# Patient Record
Sex: Male | Born: 1995 | Race: Black or African American | Hispanic: No | Marital: Single | Smoking: Never smoker
Health system: Southern US, Community
[De-identification: ages and names within clinical notes are randomized; demographics above are authoritative.]

---

## 2017-07-29 ENCOUNTER — Encounter (HOSPITAL_COMMUNITY): Payer: Self-pay

## 2017-07-29 ENCOUNTER — Other Ambulatory Visit: Payer: Self-pay

## 2017-07-29 ENCOUNTER — Emergency Department (HOSPITAL_COMMUNITY)
Admission: EM | Admit: 2017-07-29 | Discharge: 2017-07-29 | Disposition: A | Discharge status: Home / Self Care | Payer: Commercial Managed Care - PPO | Attending: Emergency Medicine | Admitting: Emergency Medicine

## 2017-07-29 ENCOUNTER — Emergency Department (HOSPITAL_COMMUNITY): Payer: Commercial Managed Care - PPO

## 2017-07-29 DIAGNOSIS — F12188 Cannabis abuse with other cannabis-induced disorder: Secondary | ICD-10-CM

## 2017-07-29 DIAGNOSIS — R112 Nausea with vomiting, unspecified: Secondary | ICD-10-CM | POA: Diagnosis present

## 2017-07-29 DIAGNOSIS — R451 Restlessness and agitation: Secondary | ICD-10-CM | POA: Insufficient documentation

## 2017-07-29 LAB — CBC WITH DIFFERENTIAL/PLATELET
Basophils Absolute: 0 10*3/uL (ref 0.0–0.1)
Basophils Relative: 0 %
EOS PCT: 0 %
Eosinophils Absolute: 0 10*3/uL (ref 0.0–0.7)
HCT: 39.8 % (ref 39.0–52.0)
HEMOGLOBIN: 13.5 g/dL (ref 13.0–17.0)
LYMPHS PCT: 11 %
Lymphs Abs: 1.5 10*3/uL (ref 0.7–4.0)
MCH: 29.9 pg (ref 26.0–34.0)
MCHC: 33.9 g/dL (ref 30.0–36.0)
MCV: 88.2 fL (ref 78.0–100.0)
MONOS PCT: 9 %
Monocytes Absolute: 1.2 10*3/uL — ABNORMAL HIGH (ref 0.1–1.0)
Neutro Abs: 11 10*3/uL — ABNORMAL HIGH (ref 1.7–7.7)
Neutrophils Relative %: 80 %
PLATELETS: 135 10*3/uL — AB (ref 150–400)
RBC: 4.51 MIL/uL (ref 4.22–5.81)
RDW: 13.4 % (ref 11.5–15.5)
WBC: 13.7 10*3/uL — AB (ref 4.0–10.5)

## 2017-07-29 LAB — COMPREHENSIVE METABOLIC PANEL
ALK PHOS: 53 U/L (ref 38–126)
ALT: 21 U/L (ref 17–63)
AST: 27 U/L (ref 15–41)
Albumin: 4.4 g/dL (ref 3.5–5.0)
Anion gap: 8 (ref 5–15)
BUN: 20 mg/dL (ref 6–20)
CO2: 23 mmol/L (ref 22–32)
Calcium: 9 mg/dL (ref 8.9–10.3)
Chloride: 109 mmol/L (ref 101–111)
Creatinine, Ser: 1.12 mg/dL (ref 0.61–1.24)
Glucose, Bld: 112 mg/dL — ABNORMAL HIGH (ref 65–99)
Potassium: 3.8 mmol/L (ref 3.5–5.1)
Sodium: 140 mmol/L (ref 135–145)
TOTAL PROTEIN: 6.6 g/dL (ref 6.5–8.1)
Total Bilirubin: 1.2 mg/dL (ref 0.3–1.2)

## 2017-07-29 LAB — RAPID URINE DRUG SCREEN, HOSP PERFORMED
AMPHETAMINES: NOT DETECTED
BARBITURATES: NOT DETECTED
BENZODIAZEPINES: NOT DETECTED
COCAINE: NOT DETECTED
OPIATES: NOT DETECTED
TETRAHYDROCANNABINOL: POSITIVE — AB

## 2017-07-29 LAB — LIPASE, BLOOD: Lipase: 24 U/L (ref 11–51)

## 2017-07-29 LAB — ETHANOL: Alcohol, Ethyl (B): <10 mg/dL (ref <10)

## 2017-07-29 MED ORDER — SODIUM CHLORIDE 0.9 % IV BOLUS (SEPSIS)
1000.0000 mL | Freq: Once | INTRAVENOUS | Status: AC
  Administered 2017-07-29: 1000 mL via INTRAVENOUS

## 2017-07-29 MED ORDER — LORAZEPAM 2 MG/ML IJ SOLN
2.0000 mg | Freq: Once | INTRAMUSCULAR | Status: AC
  Administered 2017-07-29: 2 mg via INTRAVENOUS
  Filled 2017-07-29: qty 1

## 2017-07-29 MED ORDER — HALOPERIDOL LACTATE 5 MG/ML IJ SOLN: 5.0000 mg | Freq: Once | INTRAMUSCULAR | Status: DC

## 2017-07-29 MED ORDER — CAPSAICIN 0.025 % EX CREA
TOPICAL_CREAM | Freq: Once | CUTANEOUS | Status: DC
  Filled 2017-07-29: qty 60

## 2017-07-29 MED ORDER — ONDANSETRON HCL 4 MG/2ML IJ SOLN
4.0000 mg | Freq: Once | INTRAMUSCULAR | Status: AC
  Administered 2017-07-29: 4 mg via INTRAVENOUS
  Filled 2017-07-29: qty 2

## 2017-07-29 MED ORDER — HALOPERIDOL LACTATE 5 MG/ML IJ SOLN
2.0000 mg | Freq: Once | INTRAMUSCULAR | Status: AC
  Administered 2017-07-29: 2 mg via INTRAVENOUS
  Filled 2017-07-29: qty 1

## 2017-07-29 NOTE — ED Triage Notes (Signed)
Pt admits to etoh use pot use now abdominal pain with nausea voiced pt diaphoretic and unable to lie still.

## 2017-07-29 NOTE — Discharge Instructions (Signed)
Your nausea and vomiting is likely due to marijuana use, please stop using marijuana, this will be the best thing to treat the symptoms.  You can use Zofran to treat your symptoms.  Please drink lots of fluids, start with bland foods and advance to your typical diet.  Please use the phone number provided to establish care with a primary doctor.  If you are having worsening abdominal pain fevers or chills, or nausea and vomiting despite medications please return to the emergency department for sooner evaluation.

## 2017-07-29 NOTE — ED Notes (Signed)
Bed: WA17 Expected date:  Expected time:  Means of arrival:  Comments: etoh 

## 2017-07-29 NOTE — ED Provider Notes (Signed)
[facility]-EMERGENCY DEPT Provider Note   CSN: [id number] Arrival date & time: 07/29/17  0544     History   Chief Complaint Chief Complaint  Patient presents with  . Alcohol Intoxication    HPI  Douglas Fisher is a 21 y.o. Male with no pertinent past medical history, who presents with persistent nausea and vomiting and epigastric abdominal pain.  Patient reports he was drinking for most of the day yesterday as well as on Friday night and then started having vomiting and he was unable to stop. Pt reports there is nothing left on his stomach but he has had persistent dry heaving. No hematemesis. Pt describes abdominal pain as diffuse, but worse in the epigastrium, pain is constant. No meds PTA to treat these symptoms. Pt denies fevers or chills, no diarrhea or constipation. Patient endorses heavy marijuana daily, denies any other drugs.  Reports he does not typically drink like that, had not had any alcohol in the days prior.  Patient denies any chest pain or shortness of breath.  Reports slight frontal  headache and continued nausea, otherwise no complaints.        History reviewed. No pertinent past medical history.  There are no active problems to display for this patient.   History reviewed. No pertinent surgical history.     Home Medications    Prior to Admission medications   Not on File    Family History History reviewed. No pertinent family history.  Social History Social History   Tobacco Use  . Smoking status: Never Smoker  . Smokeless tobacco: Never Used  Substance Use Topics  . Alcohol use: Yes  . Drug use: Yes    Types: Marijuana     Allergies   Patient has no known allergies.   Review of Systems Review of Systems  Constitutional: Negative for chills and fever.  HENT: Negative for congestion, rhinorrhea and sore throat.   Eyes: Negative for photophobia and visual disturbance.  Respiratory: Negative for cough, chest  tightness and shortness of breath.   Cardiovascular: Negative for chest pain and palpitations.  Gastrointestinal: Positive for abdominal pain, nausea and vomiting. Negative for blood in stool, constipation and diarrhea.  Genitourinary: Negative for dysuria and flank pain.  Musculoskeletal: Negative for arthralgias and myalgias.  Skin: Negative for pallor and rash.  Neurological: Positive for headaches. Negative for dizziness, seizures, syncope, weakness and numbness.     Physical Exam Updated Vital Signs Ht 6' (1.829 m)   Wt 72.6 kg (160 lb)   BMI 21.70 kg/m   Physical Exam  Constitutional: He is oriented to person, place, and time. He appears well-developed and well-nourished.  Pt agitated with persistent retching on arrival, given ativan and zofran. On initial evaluation pt is calm and somnolent but easily arouseable and able to answer questions appropriately  HENT:  Head: Normocephalic and atraumatic.  Eyes: Right eye exhibits no discharge. Left eye exhibits no discharge.  Cardiovascular: Normal rate, regular rhythm, normal heart sounds and intact distal pulses.  Pulmonary/Chest: Effort normal and breath sounds normal. No stridor. No respiratory distress. He has no wheezes. He has no rales. He exhibits no tenderness.  Abdominal: Soft. Bowel sounds are normal. He exhibits no distension and no mass. There is tenderness. There is no guarding.  Abd is soft and nondistended, mild tenderness throughout, worse at epigastrium, no guarding, negative Murphy's sign, nontender at McBurney's point, no peritoneal signs. No CVA tenderness  Musculoskeletal: He exhibits no edema or deformity.  Neurological:  He is alert and oriented to person, place, and time. Coordination normal.  Skin: Skin is warm and dry. Capillary refill takes less than 2 seconds.  Psychiatric: He has a normal mood and affect. His behavior is normal.  Nursing note and vitals reviewed.    ED Treatments / Results  Labs (all  labs ordered are listed, but only abnormal results are displayed) Labs Reviewed  CBC WITH DIFFERENTIAL/PLATELET - Abnormal; Notable for the following components:      Result Value   WBC 13.7 (*)    Platelets 135 (*)    Neutro Abs 11.0 (*)    Monocytes Absolute 1.2 (*)    All other components within normal limits  COMPREHENSIVE METABOLIC PANEL - Abnormal; Notable for the following components:   Glucose, Bld 112 (*)    All other components within normal limits  RAPID URINE DRUG SCREEN, HOSP PERFORMED - Abnormal; Notable for the following components:   Tetrahydrocannabinol POSITIVE (*)    All other components within normal limits  LIPASE, BLOOD  ETHANOL    EKG  EKG Interpretation  Date/Time:  Sunday July 29 2017 10:16:33 EST Ventricular Rate:  60 PR Interval:    QRS Duration: 93 QT Interval:  459 QTC Calculation: 459 R Axis:   82 Text Interpretation:  Sinus rhythm RSR' in V1 or V2, probably normal variant LVH by voltage Nonspecific T abnrm, anterolateral leads ST elev, probable normal early repol pattern Confirmed by Tilden Fossa [id number]) on 07/29/2017 11:54:16 AM       Radiology Dg Chest 2 View  Result Date: 07/29/2017 CLINICAL DATA:  21 year old male with abdominal pain, nausea and diaphoresis. EXAM: CHEST  2 VIEW COMPARISON:  None. FINDINGS: Technologist notes the patient could not tolerate optimal positioning; the best possible images were obtained. The heart size and mediastinal contours are within normal limits. Both lungs are clear. The visualized skeletal structures are unremarkable. IMPRESSION: No active cardiopulmonary disease. Electronically Signed   By: Sande Brothers M.D.   On: 07/29/2017 09:19    Procedures Procedures (including critical care time)  Medications Ordered in ED Medications  LORazepam (ATIVAN) injection 2 mg (2 mg Intravenous Given 07/29/17 0620)  sodium chloride 0.9 % bolus 1,000 mL (1,000 mLs Intravenous New Bag/Given 07/29/17 0619)    ondansetron (ZOFRAN) injection 4 mg (4 mg Intravenous Given 07/29/17 0620)     Initial Impression / Assessment and Plan / ED Course  I have reviewed the triage vital signs and the nursing notes.  Pertinent labs & imaging results that were available during my care of the patient were reviewed by me and considered in my medical decision making (see chart for details).  Pt presents with persistent nausea and vomiting, pt reports heaving alcohol use yesterday and heavy daily marijuana use. On further review of pts chart in CareEverywhere, pt has been seen multiple times with similar presentation and diagnosed with cannabis hyperemesis syndrome. Pt has had multiple negative abdominal CT scans in the past year. Vital signs normal and pt in no acute distress after ativan and zofran. Pt with some diffuse tenderness on exam, worst at epigastrium, no peritoneal signs, doubt intraabdominal pathology requiring surgical intervention. Do not think that imaging would be helpful at this time. Concern for alcohol intoxication versus cannabis hyperemesis. Will hydrate with fluid bolus, and get labs.  Labs shows leukocytosis of 13.7 with left shift, can be seen with persistent retching, more likely than infection , Kidney and liver function normal, lipase normal. Ethanol level < 10 making  acute alcohol intoxication unlikley, doubt withdrawal as pt says he does not use alcohol daily. UDS is positive for THC. CXR ordered to rule out aspiration, negative for infiltrate.  10:00AM Alerted by nurse that pt has started retching again, highly suspicious for cannabis hyperemesis syndrome, will try haldol and capsaicin cream. EKG ordered to check QTc since haldol is being used, EKG unremarkable.  11:00 AM On re-eval pt is somnolent, but arousable. No additional episodes of retching. Pt reports improvement in abdominal pain, abdomen is soft and NTTP on exam. Will give medication a chance to metabolize and pt to wake up more and  then PO challenge and reassess.  On reevaluation pt able to tolerate PO fluids and solids. Pt able to ambulate steadily in the hallway without assistance. Pt stable for discharge home with zofran for nausea. Counseled pt on cessation of cannabis use. Pt to follow up with PCP. Return precautions provided. Pt expresses understanding and agrees with plan.  Patient discussed with Dr. Madilyn Hook, who saw patient as well and agrees with plan.  Final Clinical Impressions(s) / ED Diagnoses   Final diagnoses:  Cannabis hyperemesis syndrome concurrent with and due to cannabis abuse [facility])    New Prescriptions This SmartLink is deprecated. Use AVSMEDLIST instead to display the medication list for a patient.   Dartha Lodge, PA-C 07/29/17 1936    Tilden Fossa, MD 08/03/17 [phone]

## 2018-12-02 IMAGING — CR DG CHEST 2V
2 series · 2 of 2 positions shown · non-contrast
Comparison: None.

CLINICAL DATA: 21-year-old male with abdominal pain, nausea and
diaphoresis.

EXAM:
CHEST  2 VIEW

[w chest pa]
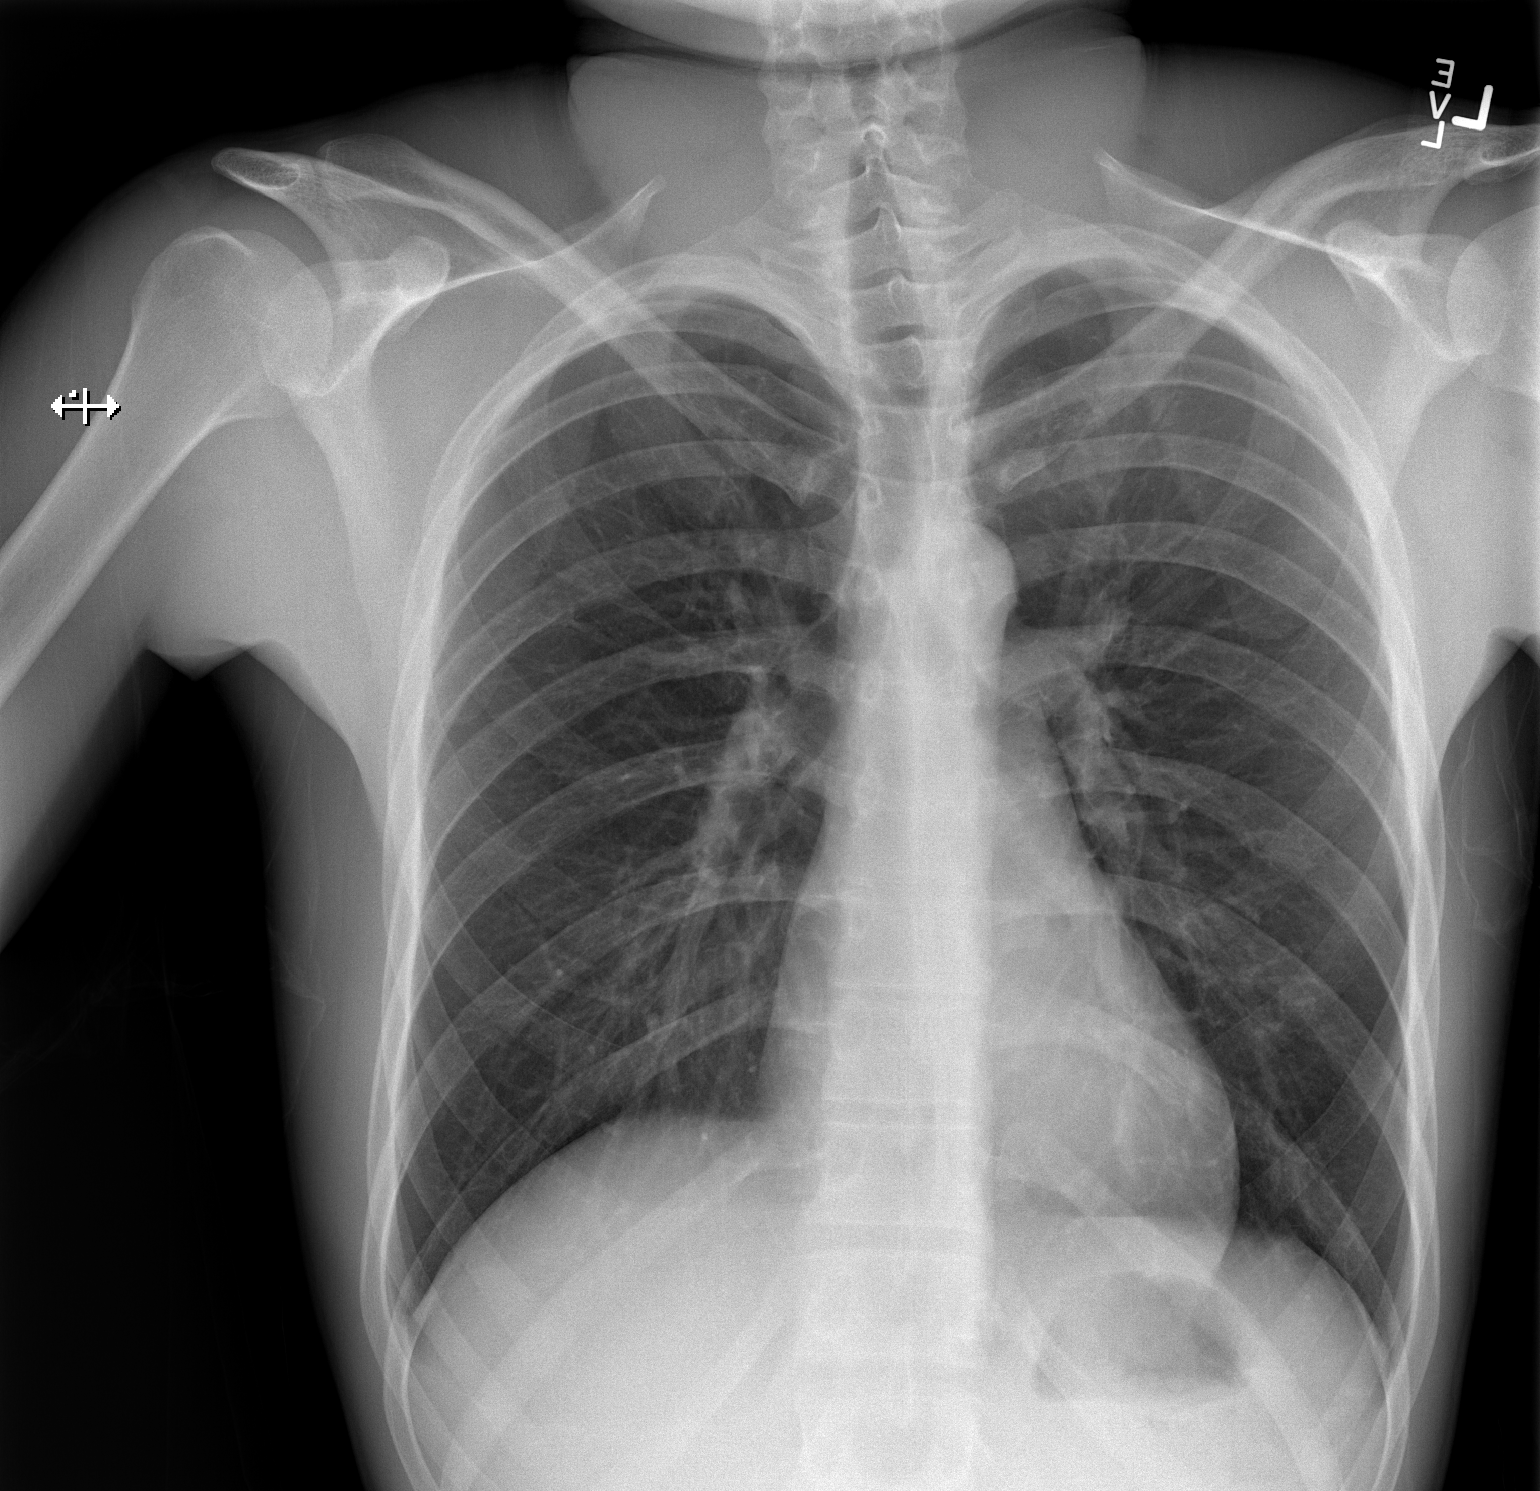

[w chest lat]
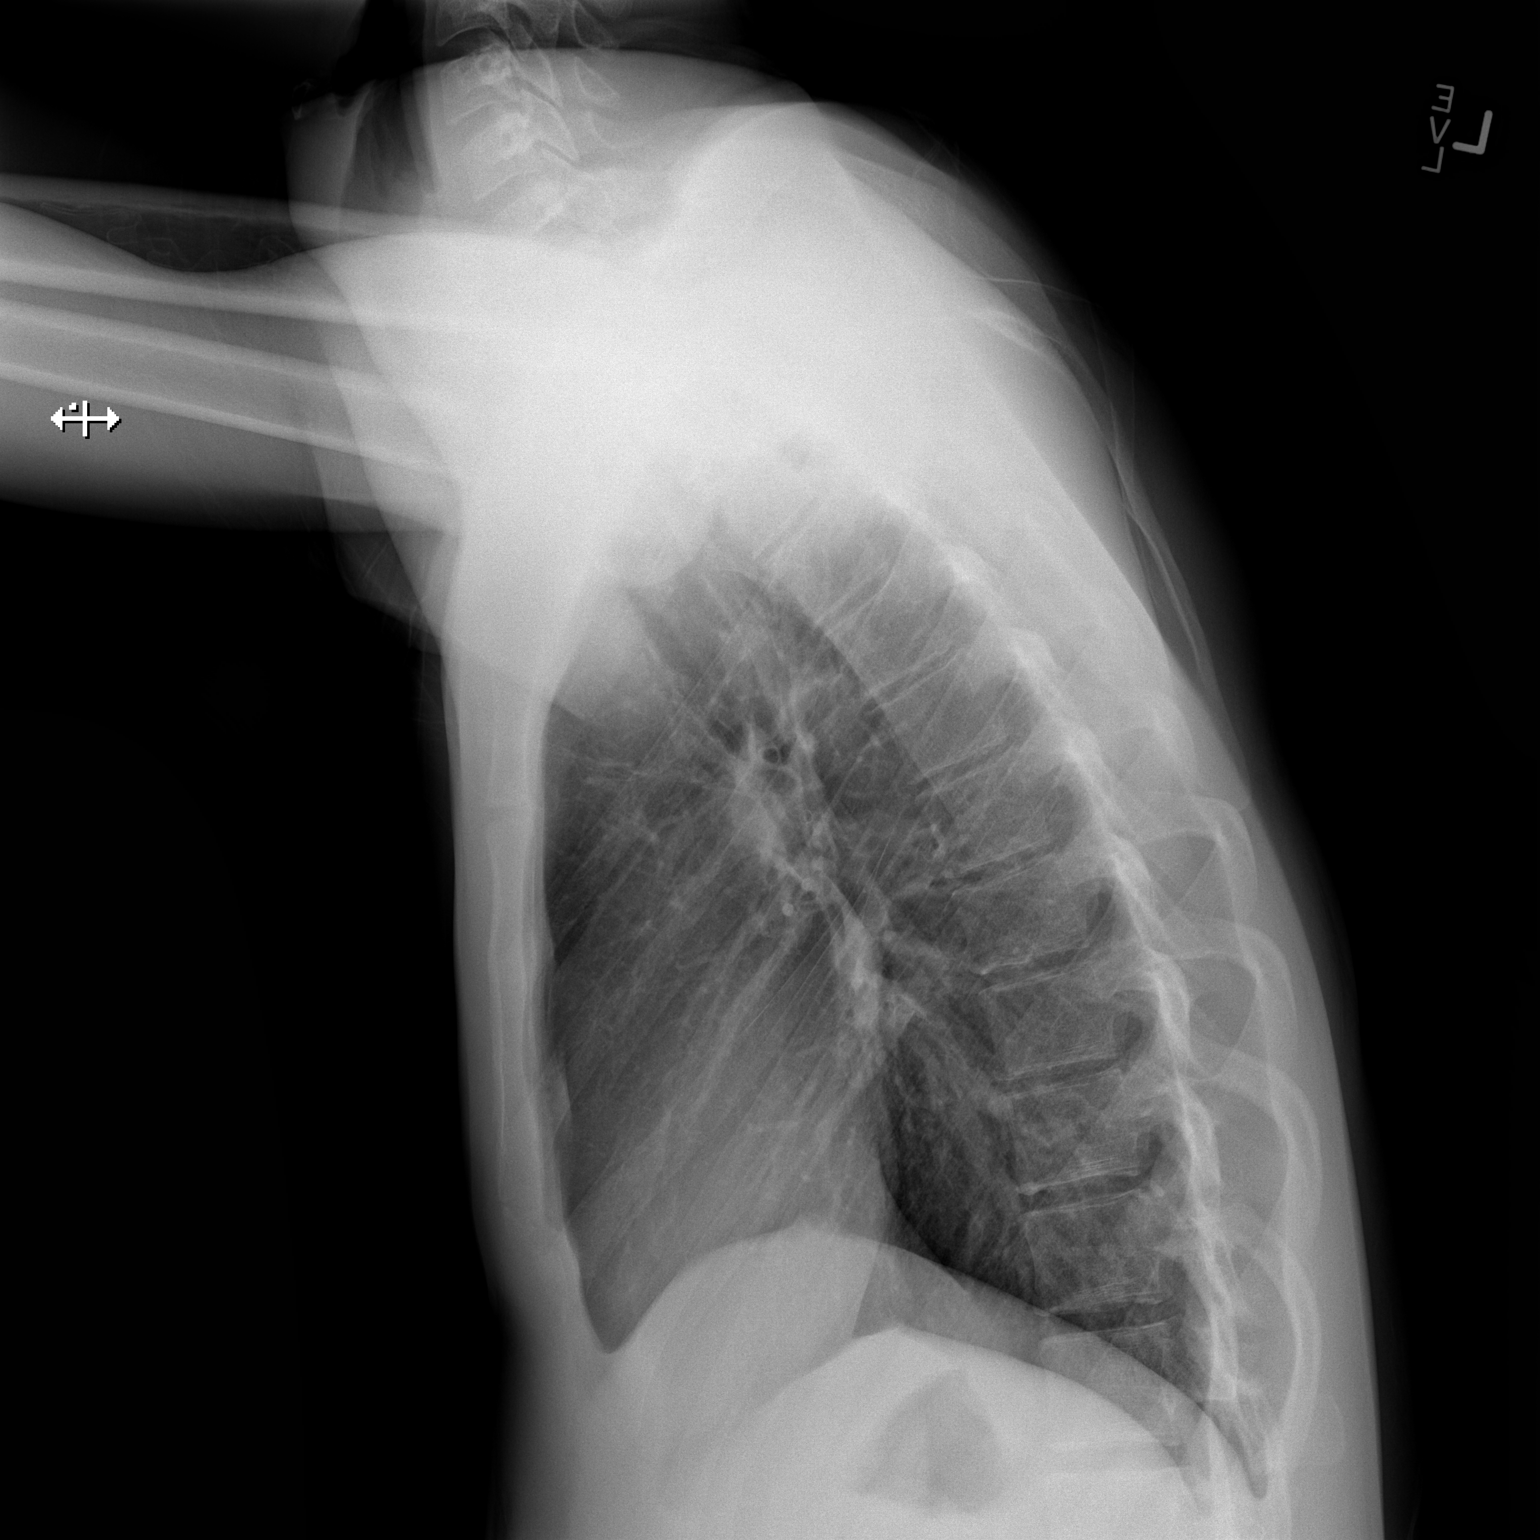

[2 of 2 positions shown; findings below may reference images not displayed]

FINDINGS: Technologist notes the patient could not tolerate optimal
positioning; the best possible images were obtained.

The heart size and mediastinal contours are within normal limits.
Both lungs are clear. The visualized skeletal structures are
unremarkable.
IMPRESSION: No active cardiopulmonary disease.

## 2020-04-04 ENCOUNTER — Encounter (HOSPITAL_COMMUNITY): Payer: Self-pay | Admitting: Emergency Medicine

## 2020-04-04 ENCOUNTER — Emergency Department (HOSPITAL_COMMUNITY): Payer: Self-pay

## 2020-04-04 ENCOUNTER — Emergency Department (HOSPITAL_COMMUNITY)
Admission: EM | Admit: 2020-04-04 | Discharge: 2020-04-04 | Disposition: A | Discharge status: Home / Self Care | Payer: Self-pay | Attending: Emergency Medicine | Admitting: Emergency Medicine

## 2020-04-04 ENCOUNTER — Other Ambulatory Visit: Payer: Self-pay

## 2020-04-04 DIAGNOSIS — S62336A Displaced fracture of neck of fifth metacarpal bone, right hand, initial encounter for closed fracture: Secondary | ICD-10-CM | POA: Insufficient documentation

## 2020-04-04 DIAGNOSIS — R112 Nausea with vomiting, unspecified: Secondary | ICD-10-CM | POA: Insufficient documentation

## 2020-04-04 DIAGNOSIS — R519 Headache, unspecified: Secondary | ICD-10-CM | POA: Insufficient documentation

## 2020-04-04 DIAGNOSIS — S62611A Displaced fracture of proximal phalanx of left index finger, initial encounter for closed fracture: Secondary | ICD-10-CM | POA: Insufficient documentation

## 2020-04-04 DIAGNOSIS — R251 Tremor, unspecified: Secondary | ICD-10-CM | POA: Insufficient documentation

## 2020-04-04 DIAGNOSIS — Y929 Unspecified place or not applicable: Secondary | ICD-10-CM | POA: Insufficient documentation

## 2020-04-04 DIAGNOSIS — Y999 Unspecified external cause status: Secondary | ICD-10-CM | POA: Insufficient documentation

## 2020-04-04 DIAGNOSIS — R4182 Altered mental status, unspecified: Secondary | ICD-10-CM | POA: Insufficient documentation

## 2020-04-04 DIAGNOSIS — Y939 Activity, unspecified: Secondary | ICD-10-CM | POA: Insufficient documentation

## 2020-04-04 LAB — COMPREHENSIVE METABOLIC PANEL
ALT: 17 U/L (ref 0–44)
AST: 28 U/L (ref 15–41)
Albumin: 4.7 g/dL (ref 3.5–5.0)
Alkaline Phosphatase: 65 U/L (ref 38–126)
Anion gap: 13 (ref 5–15)
BUN: 12 mg/dL (ref 6–20)
CO2: 22 mmol/L (ref 22–32)
Calcium: 10.1 mg/dL (ref 8.9–10.3)
Chloride: 108 mmol/L (ref 98–111)
Creatinine, Ser: 1.28 mg/dL — ABNORMAL HIGH (ref 0.61–1.24)
GFR calc Af Amer: >60 mL/min (ref >60)
GFR calc non Af Amer: >60 mL/min (ref >60)
Glucose, Bld: 143 mg/dL — ABNORMAL HIGH (ref 70–99)
Potassium: 4.1 mmol/L (ref 3.5–5.1)
Sodium: 143 mmol/L (ref 135–145)
Total Bilirubin: 1.1 mg/dL (ref 0.3–1.2)
Total Protein: 7 g/dL (ref 6.5–8.1)

## 2020-04-04 LAB — CBC WITH DIFFERENTIAL/PLATELET
Abs Immature Granulocytes: 0.07 10*3/uL (ref 0.00–0.07)
Basophils Absolute: 0 10*3/uL (ref 0.0–0.1)
Basophils Relative: 0 %
Eosinophils Absolute: 0 10*3/uL (ref 0.0–0.5)
Eosinophils Relative: 0 %
HCT: 42.6 % (ref 39.0–52.0)
Hemoglobin: 14.1 g/dL (ref 13.0–17.0)
Immature Granulocytes: 0 %
Lymphocytes Relative: 10 %
Lymphs Abs: 1.6 10*3/uL (ref 0.7–4.0)
MCH: 29.8 pg (ref 26.0–34.0)
MCHC: 33.1 g/dL (ref 30.0–36.0)
MCV: 90.1 fL (ref 80.0–100.0)
Monocytes Absolute: 0.8 10*3/uL (ref 0.1–1.0)
Monocytes Relative: 5 %
Neutro Abs: 14.2 10*3/uL — ABNORMAL HIGH (ref 1.7–7.7)
Neutrophils Relative %: 85 %
Platelets: 188 10*3/uL (ref 150–400)
RBC: 4.73 MIL/uL (ref 4.22–5.81)
RDW: 13.1 % (ref 11.5–15.5)
WBC: 16.7 10*3/uL — ABNORMAL HIGH (ref 4.0–10.5)
nRBC: 0 % (ref 0.0–0.2)

## 2020-04-04 LAB — CBG MONITORING, ED: Glucose-Capillary: 119 mg/dL — ABNORMAL HIGH (ref 70–99)

## 2020-04-04 LAB — ACETAMINOPHEN LEVEL: Acetaminophen (Tylenol), Serum: <10 ug/mL — ABNORMAL LOW (ref 10–30)

## 2020-04-04 LAB — ETHANOL: Alcohol, Ethyl (B): <10 mg/dL (ref <10)

## 2020-04-04 LAB — SALICYLATE LEVEL: Salicylate Lvl: <7 mg/dL — ABNORMAL LOW (ref 7.0–30.0)

## 2020-04-04 MED ORDER — ONDANSETRON 4 MG PO TBDP
4.0000 mg | ORAL_TABLET | Freq: Once | ORAL | Status: AC
  Administered 2020-04-04: 4 mg via ORAL
  Filled 2020-04-04: qty 1

## 2020-04-04 MED ORDER — LORAZEPAM 2 MG/ML IJ SOLN
1.0000 mg | Freq: Once | INTRAMUSCULAR | Status: AC
  Administered 2020-04-04: 1 mg via INTRAVENOUS
  Filled 2020-04-04: qty 1

## 2020-04-04 MED ORDER — ONDANSETRON HCL 4 MG/2ML IJ SOLN
4.0000 mg | Freq: Once | INTRAMUSCULAR | Status: AC
  Administered 2020-04-04: 4 mg via INTRAVENOUS
  Filled 2020-04-04: qty 2

## 2020-04-04 MED ORDER — SODIUM CHLORIDE 0.9 % IV BOLUS
1000.0000 mL | Freq: Once | INTRAVENOUS | Status: AC
  Administered 2020-04-04: 1000 mL via INTRAVENOUS

## 2020-04-04 NOTE — ED Notes (Signed)
Pt's HR not picking up on monitor, palpated at 86

## 2020-04-04 NOTE — Discharge Instructions (Addendum)
Follow-up with primary doctor and return to the ER if symptoms significantly worsen or change. 

## 2020-04-04 NOTE — ED Notes (Signed)
Discharge instructions reviewed with pt. Pt verbalized understanding.   

## 2020-04-04 NOTE — Progress Notes (Signed)
Orthopedic Tech Progress Note Patient Details:  Emmanuell Kantz April 07, 1996 [id number]  Ortho Devices Type of Ortho Device: Finger splint, Ulna gutter splint Ortho Device/Splint Location: Left second phalanx,Right upper extremity Ortho Device/Splint Interventions: Ordered, Application   Post Interventions Patient Tolerated: Well Instructions Provided: Adjustment of device, Poper ambulation with device, Care of device   Keishawna Carranza P Harle Stanford 04/04/2020, 3:20 PM

## 2020-04-04 NOTE — ED Notes (Signed)
Pt sipping on sprite, able to keep it down. PA informed.
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
## 2020-04-04 NOTE — ED Provider Notes (Signed)
[facility] EMERGENCY DEPARTMENT Provider Note   CSN: [id number] Arrival date & time: 04/04/20  0116     History Chief Complaint  Patient presents with  . Altered Mental Status    Douglas Fisher is a 24 y.o. male.  Patient is a 24 year old male brought for evaluation of altered mental status.  History is somewhat limited as patient not responding appropriately.  From what I am told, he was smoking marijuana earlier this evening, then began with nausea, shaking, and thrashing around.  He was brought by family for evaluation of this.  Not much additional history is obtainable due to patient mental status.  The history is provided by the patient.       No past medical history on file.  There are no problems to display for this patient.        No family history on file.  Social History   Tobacco Use  . Smoking status: Not on file  Substance Use Topics  . Alcohol use: Not on file  . Drug use: Not on file    Home Medications Prior to Admission medications   Not on File    Allergies    Patient has no allergy information on record.  Review of Systems   Review of Systems  All other systems reviewed and are negative.   Physical Exam Updated Vital Signs BP 136/84   Pulse 78   Temp 97.7 F (36.5 C) (Oral)   Resp (!) 26   SpO2 100%   Physical Exam Vitals and nursing note reviewed.  Constitutional:      General: He is not in acute distress.    Appearance: He is well-developed. He is not diaphoretic.  HENT:     Head: Normocephalic and atraumatic.  Cardiovascular:     Rate and Rhythm: Normal rate and regular rhythm.     Heart sounds: No murmur heard.  No friction rub.  Pulmonary:     Effort: Pulmonary effort is normal. No respiratory distress.     Breath sounds: Normal breath sounds. No wheezing or rales.  Abdominal:     General: Bowel sounds are normal. There is no distension.     Palpations: Abdomen is soft.     Tenderness: There is no  abdominal tenderness.  Musculoskeletal:        General: Normal range of motion.     Cervical back: Normal range of motion and neck supple.  Skin:    General: Skin is warm and dry.  Neurological:     Mental Status: He is alert.     Coordination: Coordination normal.     Comments: Patient is awake and alert, however not behaving appropriately.  He appears under the influence.  He moves all extremities with purpose.     ED Results / Procedures / Treatments   Labs (all labs ordered are listed, but only abnormal results are displayed) Labs Reviewed  CBG MONITORING, ED - Abnormal; Notable for the following components:      Result Value   Glucose-Capillary 119 (*)    All other components within normal limits  ETHANOL  COMPREHENSIVE METABOLIC PANEL  ACETAMINOPHEN LEVEL  SALICYLATE LEVEL  CBC WITH DIFFERENTIAL/PLATELET  URINALYSIS, ROUTINE W REFLEX MICROSCOPIC  RAPID URINE DRUG SCREEN, HOSP PERFORMED    EKG EKG Interpretation  Date/Time:  Sunday April 04 2020 03:38:25 EDT Ventricular Rate:  57 PR Interval:    QRS Duration: 104 QT Interval:  432 QTC Calculation: 421 R Axis:  83 Text Interpretation: Sinus rhythm RSR' in V1 or V2, probably normal variant ST elevation suggests acute pericarditis Confirmed by Geoffery Lyons ([id number]) on 04/04/2020 6:48:50 AM   Radiology No results found.  Procedures Procedures (including critical care time)  Medications Ordered in ED Medications  sodium chloride 0.9 % bolus 1,000 mL (has no administration in time range)    ED Course  I have reviewed the triage vital signs and the nursing notes.  Pertinent labs & imaging results that were available during my care of the patient were reviewed by me and considered in my medical decision making (see chart for details).  Patient presenting here with complaints of nausea, vomiting, and hyperventilation.  Patient apparently used marijuana prior to coming here and I suspect this was a side effect.   He was initially hyperventilating and writhing around his exam stretcher.  Patient given Ativan and Zofran and symptoms seem to resolve.  He has been observed here in the ER and is now resting comfortably and back to his baseline.  His laboratory studies are all essentially unremarkable.  At this point, I feel as though discharge is appropriate.  He is to follow-up as needed.    MDM Rules/Calculators/A&P   Final Clinical Impression(s) / ED Diagnoses Final diagnoses:  None    Rx / DC Orders ED Discharge Orders    None       Geoffery Lyons, MD 04/04/20 [phone]

## 2020-04-04 NOTE — ED Notes (Signed)
Pt stated that he continued to throw up after being put up for discharge. This RN informed PA, who said she would order more medication. Pt was informed that he could go back to his bed and he left ED.

## 2020-04-04 NOTE — Discharge Instructions (Addendum)
Slightly displaced/comminuted fracture at the proximal aspect of the  second proximal phalanx.   Displaced/comminuted fracture at the distal aspects of the RIGHT  fifth metacarpal bone, with associated mild angulation deformity.       You are seen today for hand pain, you have broken bones in both hands as noted above.  I need you to follow-up with a hand surgeon, Dr. Eulah Pont for these.  I want you to use your splints and take ibuprofen as directed on the bottle for pain.  You can also ice the area and elevate your hands.  Please come back to the emergency department for any new or worsening concerning symptoms such as numb fingers, tingling and radiation, blue fingers.  Use the attached instructions.
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
## 2020-04-04 NOTE — ED Notes (Signed)
Pt arrives from triage stating " I need warm blankets, I feel like I'm in an ice box". Pt shaking and Dr. Judd Lien at bedside. Pt is alert and oriented. Pt endorses use of marijuana this evening and states the stomach pain started afterwards. Pt denies any other drug or ETOH use.

## 2020-04-05 ENCOUNTER — Encounter (HOSPITAL_COMMUNITY): Payer: Self-pay
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
